# Patient Record
Sex: Male | Born: 1970 | Race: Black or African American | Hispanic: No | Smoking: Never smoker
Health system: Southern US, Community
[De-identification: ages and names within clinical notes are randomized; demographics above are authoritative.]

## PROBLEM LIST (undated history)

## (undated) DIAGNOSIS — E119 Type 2 diabetes mellitus without complications: Secondary | ICD-10-CM

## (undated) DIAGNOSIS — I10 Essential (primary) hypertension: Secondary | ICD-10-CM

## (undated) HISTORY — PX: SHOULDER SURGERY: SHX246

---

## 2017-04-05 ENCOUNTER — Emergency Department (HOSPITAL_COMMUNITY)
Admission: EM | Admit: 2017-04-05 | Discharge: 2017-04-05 | Disposition: A | Discharge status: Home / Self Care | Payer: Worker's Compensation | Attending: Emergency Medicine | Admitting: Emergency Medicine

## 2017-04-05 ENCOUNTER — Emergency Department (HOSPITAL_COMMUNITY): Payer: Worker's Compensation

## 2017-04-05 ENCOUNTER — Encounter (HOSPITAL_COMMUNITY): Payer: Self-pay

## 2017-04-05 DIAGNOSIS — Y9259 Other trade areas as the place of occurrence of the external cause: Secondary | ICD-10-CM | POA: Insufficient documentation

## 2017-04-05 DIAGNOSIS — I10 Essential (primary) hypertension: Secondary | ICD-10-CM | POA: Diagnosis not present

## 2017-04-05 DIAGNOSIS — Z79899 Other long term (current) drug therapy: Secondary | ICD-10-CM | POA: Insufficient documentation

## 2017-04-05 DIAGNOSIS — S0091XA Abrasion of unspecified part of head, initial encounter: Secondary | ICD-10-CM

## 2017-04-05 DIAGNOSIS — S0990XA Unspecified injury of head, initial encounter: Secondary | ICD-10-CM | POA: Diagnosis present

## 2017-04-05 DIAGNOSIS — Z7984 Long term (current) use of oral hypoglycemic drugs: Secondary | ICD-10-CM | POA: Insufficient documentation

## 2017-04-05 DIAGNOSIS — Y9389 Activity, other specified: Secondary | ICD-10-CM | POA: Diagnosis not present

## 2017-04-05 DIAGNOSIS — E119 Type 2 diabetes mellitus without complications: Secondary | ICD-10-CM | POA: Diagnosis not present

## 2017-04-05 DIAGNOSIS — Z23 Encounter for immunization: Secondary | ICD-10-CM | POA: Insufficient documentation

## 2017-04-05 DIAGNOSIS — R030 Elevated blood-pressure reading, without diagnosis of hypertension: Secondary | ICD-10-CM

## 2017-04-05 DIAGNOSIS — S0081XA Abrasion of other part of head, initial encounter: Secondary | ICD-10-CM | POA: Insufficient documentation

## 2017-04-05 DIAGNOSIS — Y990 Civilian activity done for income or pay: Secondary | ICD-10-CM | POA: Insufficient documentation

## 2017-04-05 DIAGNOSIS — M25512 Pain in left shoulder: Secondary | ICD-10-CM

## 2017-04-05 HISTORY — DX: Essential (primary) hypertension: I10

## 2017-04-05 HISTORY — DX: Type 2 diabetes mellitus without complications: E11.9

## 2017-04-05 MED ORDER — IBUPROFEN 800 MG PO TABS: 800.0000 mg | ORAL_TABLET | Freq: Three times a day (TID) | ORAL | 0 refills | Status: AC | PRN

## 2017-04-05 MED ORDER — IRBESARTAN 150 MG PO TABS
150.0000 mg | ORAL_TABLET | Freq: Once | ORAL | Status: AC
  Administered 2017-04-05: 150 mg via ORAL
  Filled 2017-04-05: qty 1

## 2017-04-05 MED ORDER — IBUPROFEN 800 MG PO TABS
800.0000 mg | ORAL_TABLET | Freq: Once | ORAL | Status: AC
  Administered 2017-04-05: 800 mg via ORAL
  Filled 2017-04-05: qty 1

## 2017-04-05 MED ORDER — METHOCARBAMOL 500 MG PO TABS: 500.0000 mg | ORAL_TABLET | Freq: Three times a day (TID) | ORAL | 0 refills | Status: AC | PRN

## 2017-04-05 MED ORDER — TETANUS-DIPHTH-ACELL PERTUSSIS 5-2.5-18.5 LF-MCG/0.5 IM SUSP
0.5000 mL | Freq: Once | INTRAMUSCULAR | Status: AC
  Administered 2017-04-05: 0.5 mL via INTRAMUSCULAR
  Filled 2017-04-05: qty 0.5

## 2017-04-05 MED ORDER — METHOCARBAMOL 500 MG PO TABS
500.0000 mg | ORAL_TABLET | Freq: Once | ORAL | Status: AC
  Administered 2017-04-05: 500 mg via ORAL
  Filled 2017-04-05: qty 1

## 2017-04-05 MED ORDER — BACITRACIN ZINC 500 UNIT/GM EX OINT
TOPICAL_OINTMENT | Freq: Once | CUTANEOUS | Status: AC
  Administered 2017-04-05: 1 via TOPICAL
  Filled 2017-04-05: qty 0.9

## 2017-04-05 MED ORDER — MUPIROCIN 2 % EX OINT: TOPICAL_OINTMENT | CUTANEOUS | 0 refills | Status: AC
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
## 2017-04-05 NOTE — Discharge Instructions (Signed)
It was my pleasure taking care of you today!   Ice affected area to decrease swelling and for pain relief. Ibuprofen as needed for pain. Robaxin is your muscle relaxer to take as needed.   It is important to take your blood pressure medication daily as directed. Your blood pressure was elevated today in the ER.   Please follow up with your primary care doctor in 1-2 weeks.   Return to ER for new or worsening symptoms, any additional concerns.

## 2017-04-05 NOTE — ED Notes (Addendum)
Patient's wounds cleansed with saline and patted dry. Dressing placed with bacitracin, telfa, and kerlex. Discharge instructions reviewed with patient. Patient verbalizes understanding. VSS.

## 2018-11-03 IMAGING — CT CT HEAD W/O CM
3 of 4 series · 15 of 47 positions shown, 18 images · non-contrast
Comparison: None.

CLINICAL DATA: Head trauma, headache

EXAM:
CT HEAD WITHOUT CONTRAST
TECHNIQUE: Contiguous axial images were obtained from the base of the skull
through the vertex without intravenous contrast.

[Series 2: head w/o · axial · non-contrast · 0.48mm/px · z∈[-116,+14]mm · 9 of 32 slices shown, 12 images]
[im 3/32  brain]
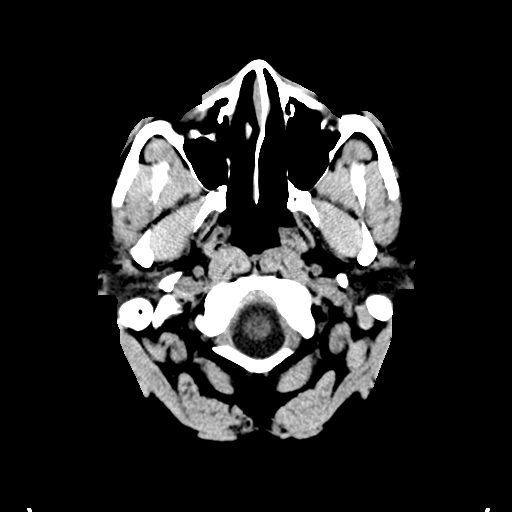
[im 3/32  bone]
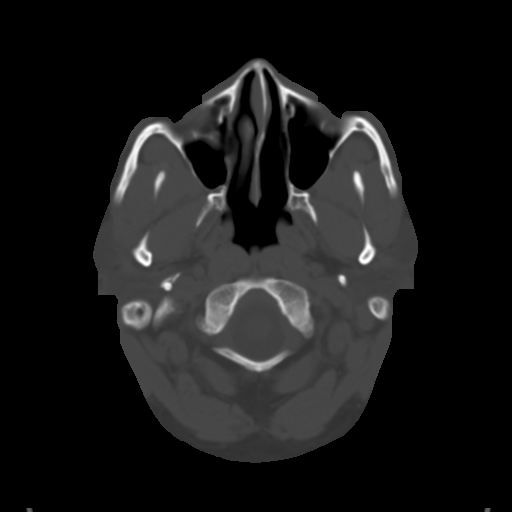
[im 7/32  brain]
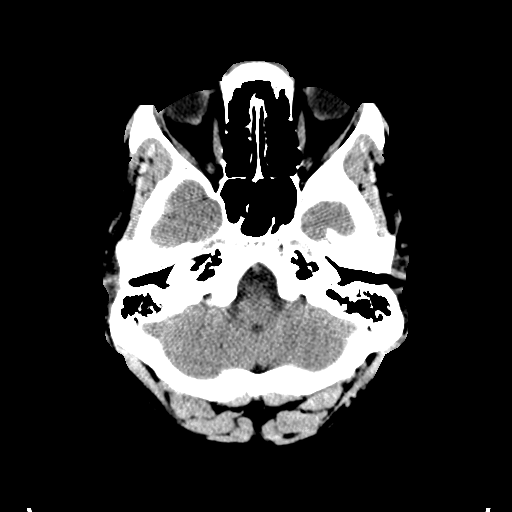
[im 9/32  brain]
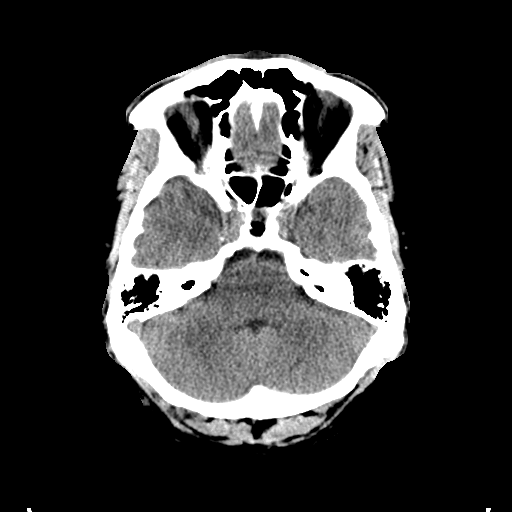
[im 14/32  brain]
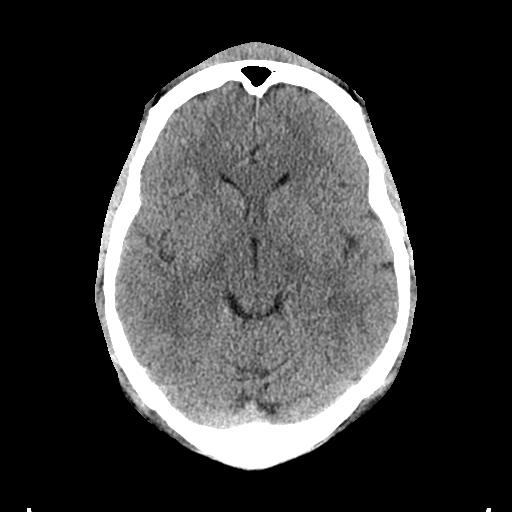
[im 16/32  brain]
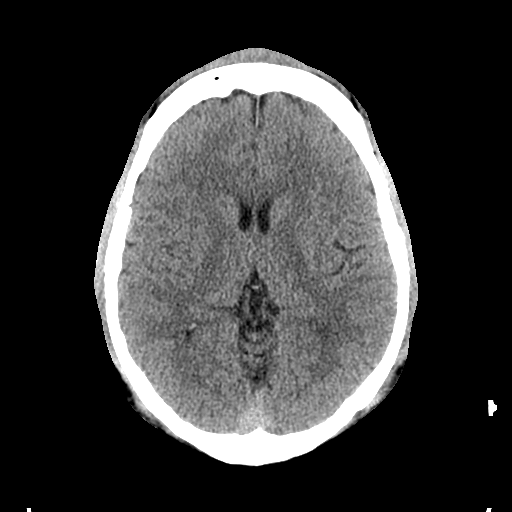
[im 16/32  bone]
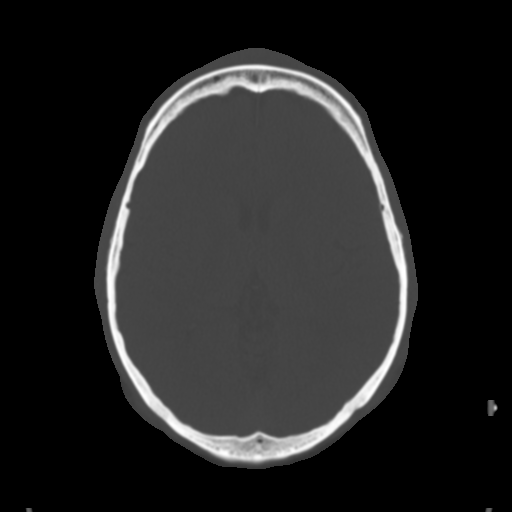
[im 18/32  brain]
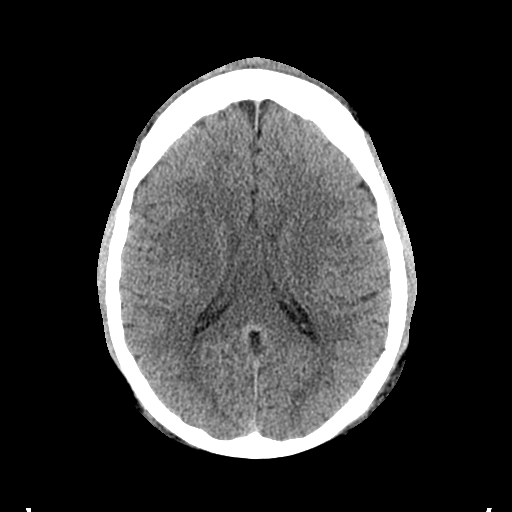
[im 23/32  brain]
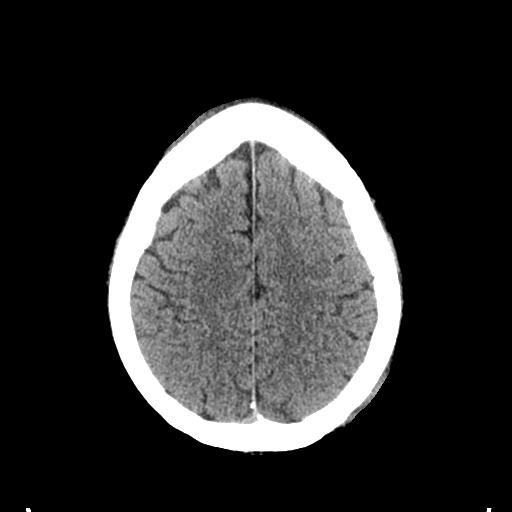
[im 25/32  brain]
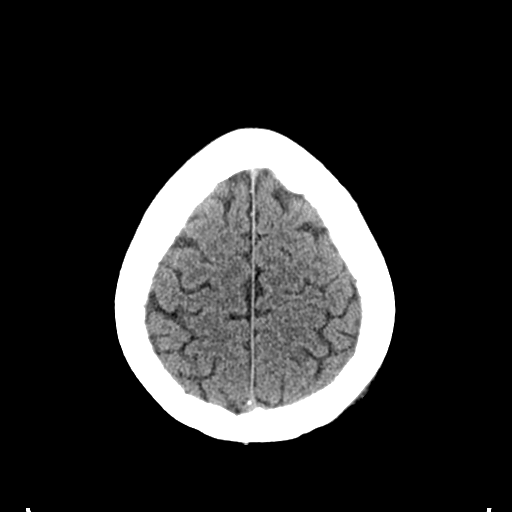
[im 29/32  brain]
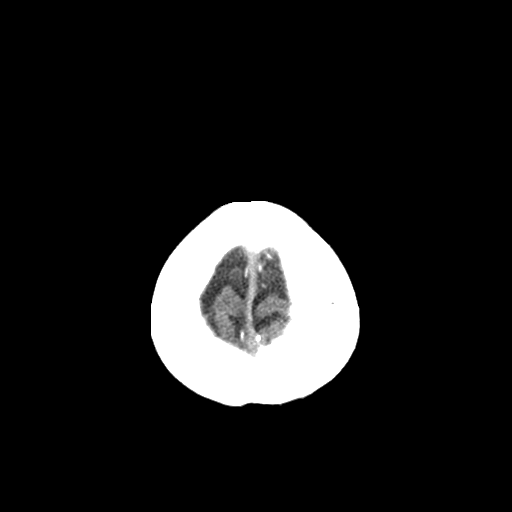
[im 29/32  bone]
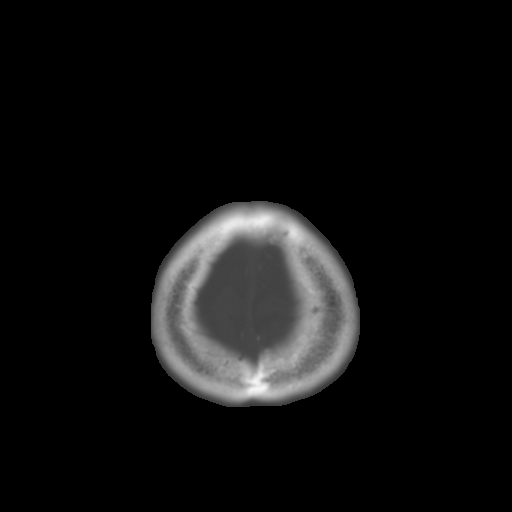

[Series 4: coronal · coronal · 0.31mm/px · 3 of 74 slices shown]
[im 25/74  brain]
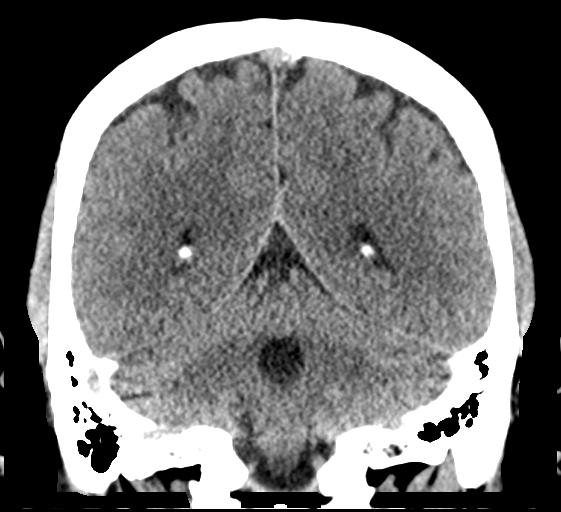
[im 33/74  brain]
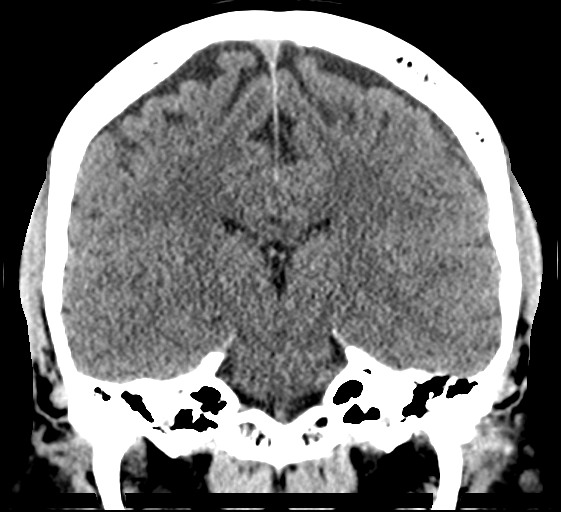
[im 41/74  brain]
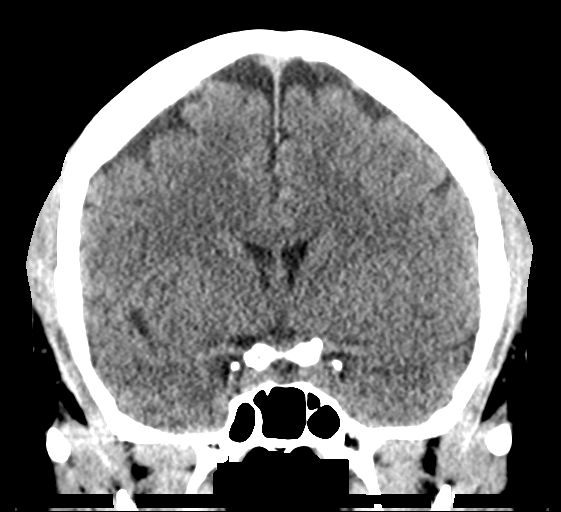

[Series 5: sagittal · sagittal · 0.31mm/px · 3 of 55 slices shown]
[im 19/55  brain]
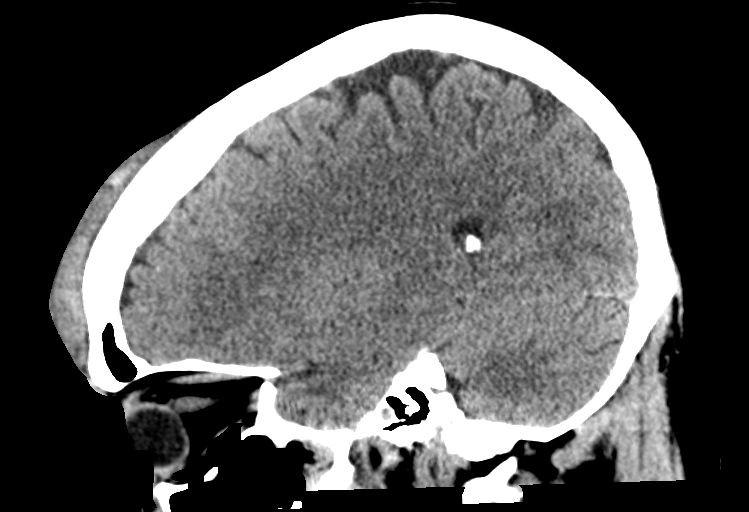
[im 28/55  brain]
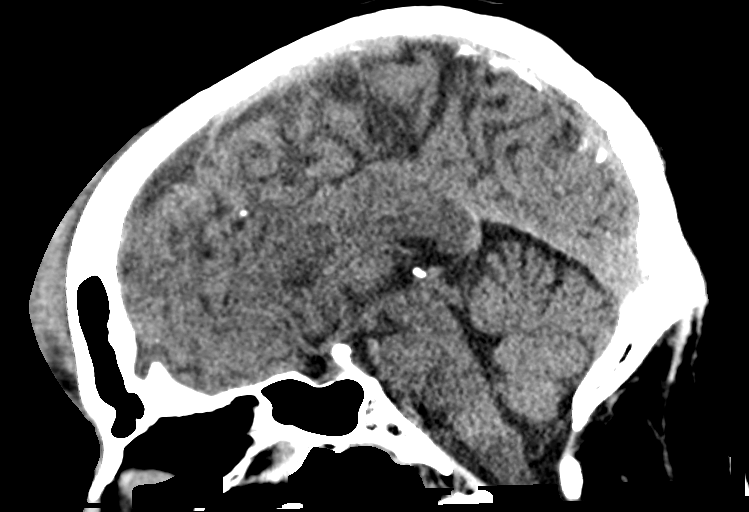
[im 37/55  brain]
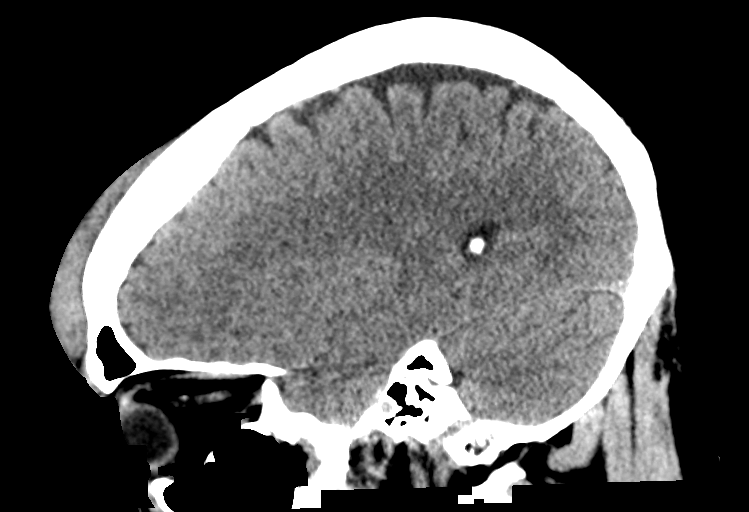

[15 of 47 positions shown; findings below may reference images not displayed]

FINDINGS: Brain: No acute territorial infarction, hemorrhage or intracranial
mass is seen. The ventricles are nonenlarged.

Vascular: No hyperdense vessel or unexpected calcification.

Skull: Normal. Negative for fracture or focal lesion.

Sinuses/Orbits: No acute finding.

Other: Large forehead soft tissue hematoma. Smaller bilateral
posterior scalp hematomas.
IMPRESSION: 1. No CT evidence for acute intracranial abnormality.
2. Large forehead hematoma with small bilateral posterior scalp
hematoma.
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
